# Patient Record
Sex: Female | Born: 1990 | Race: White | Hispanic: No | Marital: Single | Smoking: Never smoker
Health system: Southern US, Community
[De-identification: ages and names within clinical notes are randomized; demographics above are authoritative.]

## PROBLEM LIST (undated history)

## (undated) DIAGNOSIS — I10 Essential (primary) hypertension: Secondary | ICD-10-CM

## (undated) HISTORY — PX: TONSILLECTOMY: SUR1361

## (undated) HISTORY — PX: ADENOIDECTOMY: SUR15

---
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
## 2018-05-05 ENCOUNTER — Other Ambulatory Visit: Payer: Self-pay

## 2018-05-05 ENCOUNTER — Encounter (HOSPITAL_COMMUNITY): Payer: Self-pay | Admitting: Emergency Medicine

## 2018-05-05 ENCOUNTER — Emergency Department (HOSPITAL_COMMUNITY): Payer: Self-pay

## 2018-05-05 ENCOUNTER — Emergency Department (HOSPITAL_COMMUNITY)
Admission: EM | Admit: 2018-05-05 | Discharge: 2018-05-05 | Disposition: A | Discharge status: Home / Self Care | Payer: Self-pay | Attending: Emergency Medicine | Admitting: Emergency Medicine

## 2018-05-05 DIAGNOSIS — R002 Palpitations: Secondary | ICD-10-CM | POA: Insufficient documentation

## 2018-05-05 DIAGNOSIS — I10 Essential (primary) hypertension: Secondary | ICD-10-CM | POA: Insufficient documentation

## 2018-05-05 LAB — BASIC METABOLIC PANEL
Anion gap: 10 (ref 5–15)
BUN: 10 mg/dL (ref 6–20)
CO2: 21 mmol/L — ABNORMAL LOW (ref 22–32)
Calcium: 9.3 mg/dL (ref 8.9–10.3)
Chloride: 109 mmol/L (ref 98–111)
Creatinine, Ser: 0.72 mg/dL (ref 0.44–1.00)
GFR calc Af Amer: >60 mL/min (ref >60)
GFR calc non Af Amer: >60 mL/min (ref >60)
Glucose, Bld: 102 mg/dL — ABNORMAL HIGH (ref 70–99)
Potassium: 3.7 mmol/L (ref 3.5–5.1)
Sodium: 140 mmol/L (ref 135–145)

## 2018-05-05 LAB — CBC
HCT: 41 % (ref 36.0–46.0)
Hemoglobin: 11.9 g/dL — ABNORMAL LOW (ref 12.0–15.0)
MCH: 24.8 pg — ABNORMAL LOW (ref 26.0–34.0)
MCHC: 29 g/dL — ABNORMAL LOW (ref 30.0–36.0)
MCV: 85.4 fL (ref 80.0–100.0)
Platelets: 324 10*3/uL (ref 150–400)
RBC: 4.8 MIL/uL (ref 3.87–5.11)
RDW: 16.2 % — ABNORMAL HIGH (ref 11.5–15.5)
WBC: 9.3 10*3/uL (ref 4.0–10.5)
nRBC: 0 % (ref 0.0–0.2)

## 2018-05-05 LAB — TSH: TSH: 0.947 u[IU]/mL (ref 0.350–4.500)

## 2018-05-05 LAB — I-STAT BETA HCG BLOOD, ED (MC, WL, AP ONLY): I-stat hCG, quantitative: <5 m[IU]/mL (ref <5)

## 2018-05-05 LAB — D-DIMER, QUANTITATIVE: D-Dimer, Quant: <0.27 ug/mL-FEU (ref 0.00–0.50)

## 2018-05-05 LAB — TROPONIN I: Troponin I: <0.03 ng/mL (ref <0.03)

## 2018-05-05 MED ORDER — SODIUM CHLORIDE 0.9 % IV BOLUS
1000.0000 mL | Freq: Once | INTRAVENOUS | Status: AC
  Administered 2018-05-05: 1000 mL via INTRAVENOUS

## 2018-05-05 NOTE — ED Triage Notes (Signed)
Pt arrives by POV with complaints of heart racing. Pt states it has been going on for about a month and she just can not manage it any longer. Pt endorses SOB

## 2018-05-05 NOTE — ED Notes (Signed)
Patient transported to X-ray 

## 2018-05-05 NOTE — Discharge Instructions (Signed)
Please call and follow up with your primary care provider or with cardiology for further evaluation of your heart palpitation.  You may benefit from wearing a Holter Monitoring device for a more indepth cardiac evaluation of your symptom.  This can be discuss with your doctor.  Return if you have any concerns.

## 2018-05-05 NOTE — ED Notes (Signed)
Pt is transgendering and likes to be called "Caitlyn Castro"

## 2018-05-05 NOTE — ED Provider Notes (Signed)
[facility] Provider Note   CSN: [id number] Arrival date & time: 05/05/18  1013    History   Chief Complaint No chief complaint on file.   HPI Caitlyn Castro is a 28 y.o. female.     The history is provided by the patient. No language interpreter was used.  Palpitations     28 year old female presenting for evaluation of her palpitation.  Patient report for the past month she has noticed increased episodes of heart palpitation lasting seconds to minutes usually brought on by exertion but sometimes can be present at rest.  States that she would get out of breath very short of breath with simple exertion and even sometimes with walking short distance.  At times she feels lightheadedness and dizzy.  She is tries cutting of caffeine anything taking caused her heart to race without any improvement.  She does admits to trying to stay more active and trying to lose weight but this is making difficult for her to do so.  She denies any associated fever, URI symptoms, productive cough, chest pain, nausea vomiting diarrhea.  She denies any history of thyroid disease and denies any temperature sensitivity.  She also denies any prior history of PE or DVT, no recent surgery, prolonged bedrest, active cancer, hemoptysis, leg swelling or calf pain, or taking hormone pills.  Past Medical History:  Diagnosis Date  . Hypertension     There are no active problems to display for this patient.   Past Surgical History:  Procedure Laterality Date  . ADENOIDECTOMY    . TONSILLECTOMY       OB History   No obstetric history on file.      Home Medications    Prior to Admission medications   Medication Sig Start Date End Date Taking? Authorizing Provider  penicillin v potassium (VEETID) 500 MG tablet 2 po bid with food 11/04/10   Ivery QualeBryant, [place], PA-C    Family History No family history on file.  Social History Social History   Tobacco Use  .  Smoking status: Never Smoker  Substance Use Topics  . Alcohol use: No  . Drug use: No     Allergies   Patient has no known allergies.   Review of Systems Review of Systems  Cardiovascular: Positive for palpitations.  All other systems reviewed and are negative.    Physical Exam Updated Vital Signs BP 127/77   Pulse 73   Temp 98.2 F (36.8 C) (Oral)   Resp 17   Ht 5\' 2"  (1.575 m)   Wt (!) 149.7 kg   LMP 04/17/2018 (Approximate)   SpO2 97%   BMI 60.36 kg/m   Physical Exam Vitals signs and nursing note reviewed.  Constitutional:      General: She is not in acute distress.    Appearance: She is well-developed. She is obese.  HENT:     Head: Atraumatic.     Mouth/Throat:     Mouth: Mucous membranes are moist.  Eyes:     Conjunctiva/sclera: Conjunctivae normal.  Neck:     Musculoskeletal: Neck supple.     Comments: No thyromegaly Cardiovascular:     Rate and Rhythm: Normal rate and regular rhythm.     Pulses: Normal pulses.     Heart sounds: Normal heart sounds.  Pulmonary:     Effort: Pulmonary effort is normal.     Breath sounds: Normal breath sounds.  Abdominal:     Palpations: Abdomen is soft.  Tenderness: There is no abdominal tenderness.  Skin:    Capillary Refill: Capillary refill takes less than 2 seconds.     Findings: No rash.  Neurological:     Mental Status: She is alert and oriented to person, place, and time.      ED Treatments / Results  Labs (all labs ordered are listed, but only abnormal results are displayed) Labs Reviewed  BASIC METABOLIC PANEL - Abnormal; Notable for the following components:      Result Value   CO2 21 (*)    Glucose, Bld 102 (*)    All other components within normal limits  CBC - Abnormal; Notable for the following components:   Hemoglobin 11.9 (*)    MCH 24.8 (*)    MCHC 29.0 (*)    RDW 16.2 (*)    All other components within normal limits  TROPONIN I  D-DIMER, QUANTITATIVE (NOT AT [facility])  TSH   I-STAT BETA HCG BLOOD, ED ([facility], [facility], [facility] ONLY)    EKG EKG Interpretation  Date/Time:  Friday May 05 2018 10:30:04 EDT Ventricular Rate:  82 PR Interval:    QRS Duration: 101 QT Interval:  373 QTC Calculation: 436 R Axis:   67 Text Interpretation:  Sinus rhythm Low voltage, precordial leads Confirmed by Raeford Razor [phone]) on 05/05/2018 11:23:58 AM   Radiology Dg Chest 2 View  Result Date: 05/05/2018 CLINICAL DATA:  Shortness of breath on exertion started a few days ago. EXAM: CHEST - 2 VIEW COMPARISON:  04/28/2018 FINDINGS: The heart size and mediastinal contours are within normal limits. Both lungs are clear. The visualized skeletal structures are unremarkable. IMPRESSION: No active cardiopulmonary disease. Electronically Signed   By: Norva Pavlov M.D.   On: 05/05/2018 12:03    Procedures Procedures (including critical care time)  Medications Ordered in ED Medications  sodium chloride 0.9 % bolus 1,000 mL (1,000 mLs Intravenous New Bag/Given 05/05/18 1123)     Initial Impression / Assessment and Plan / ED Course  I have reviewed the triage vital signs and the nursing notes.  Pertinent labs & imaging results that were available during my care of the patient were reviewed by me and considered in my medical decision making (see chart for details).        BP 127/77   Pulse 73   Temp 98.2 F (36.8 C) (Oral)   Resp 17   Ht 5\' 2"  (1.575 m)   Wt (!) 149.7 kg   LMP 04/17/2018 (Approximate)   SpO2 97%   BMI 60.36 kg/m    Final Clinical Impressions(s) / ED Diagnoses   Final diagnoses:  Palpitations    ED Discharge Orders    None     10:52 AM Patient report intermittent heart palpitation, shortness of breath, and dizziness and lightheadedness which is been an ongoing issue for more than a month.  She is obese without any significant risk factor for PE aside from obesity.  No prior history of thyroid disease.  Pregnancy test today is negative, very mild anemia  with a hemoglobin of 11.9, EKG without concerning arrhythmia.  Will obtain TSH as well as a d-dimer along with further evaluation of her condition.  Will also check orthostatic vital sign.  1:11 PM Patient has normal orthostatic vital sign, her thyroid function panel was normal, pregnancy test is negative, electrolyte panels are reassuring, no significant anemia, normal troponin, normal d-dimer low suspicion for PE.  At this time, no acute emergent medical condition identified.  Patient  likely benefit from outpatient Holter monitoring by cardiology for further evaluation of her heart palpitation.  Resources provided.  Referral given.  Return precaution discussed.   Fayrene Helper, PA-C 05/05/18 1315    Raeford Razor, MD 05/12/18 [phone]

## 2018-06-23 ENCOUNTER — Ambulatory Visit: Payer: Medicaid Other | Admitting: Cardiovascular Disease

## 2018-06-23 ENCOUNTER — Telehealth: Payer: Self-pay | Admitting: *Deleted

## 2018-06-23 NOTE — Telephone Encounter (Signed)
Tried contacting patient but number in chart was not the right number!!!

## 2019-09-07 IMAGING — DX CHEST - 2 VIEW
2 series · 2 of 2 positions shown · non-contrast
Comparison: 04/28/2018

CLINICAL DATA: Shortness of breath on exertion started a few days
ago.

EXAM:
CHEST - 2 VIEW

[w chest pa]
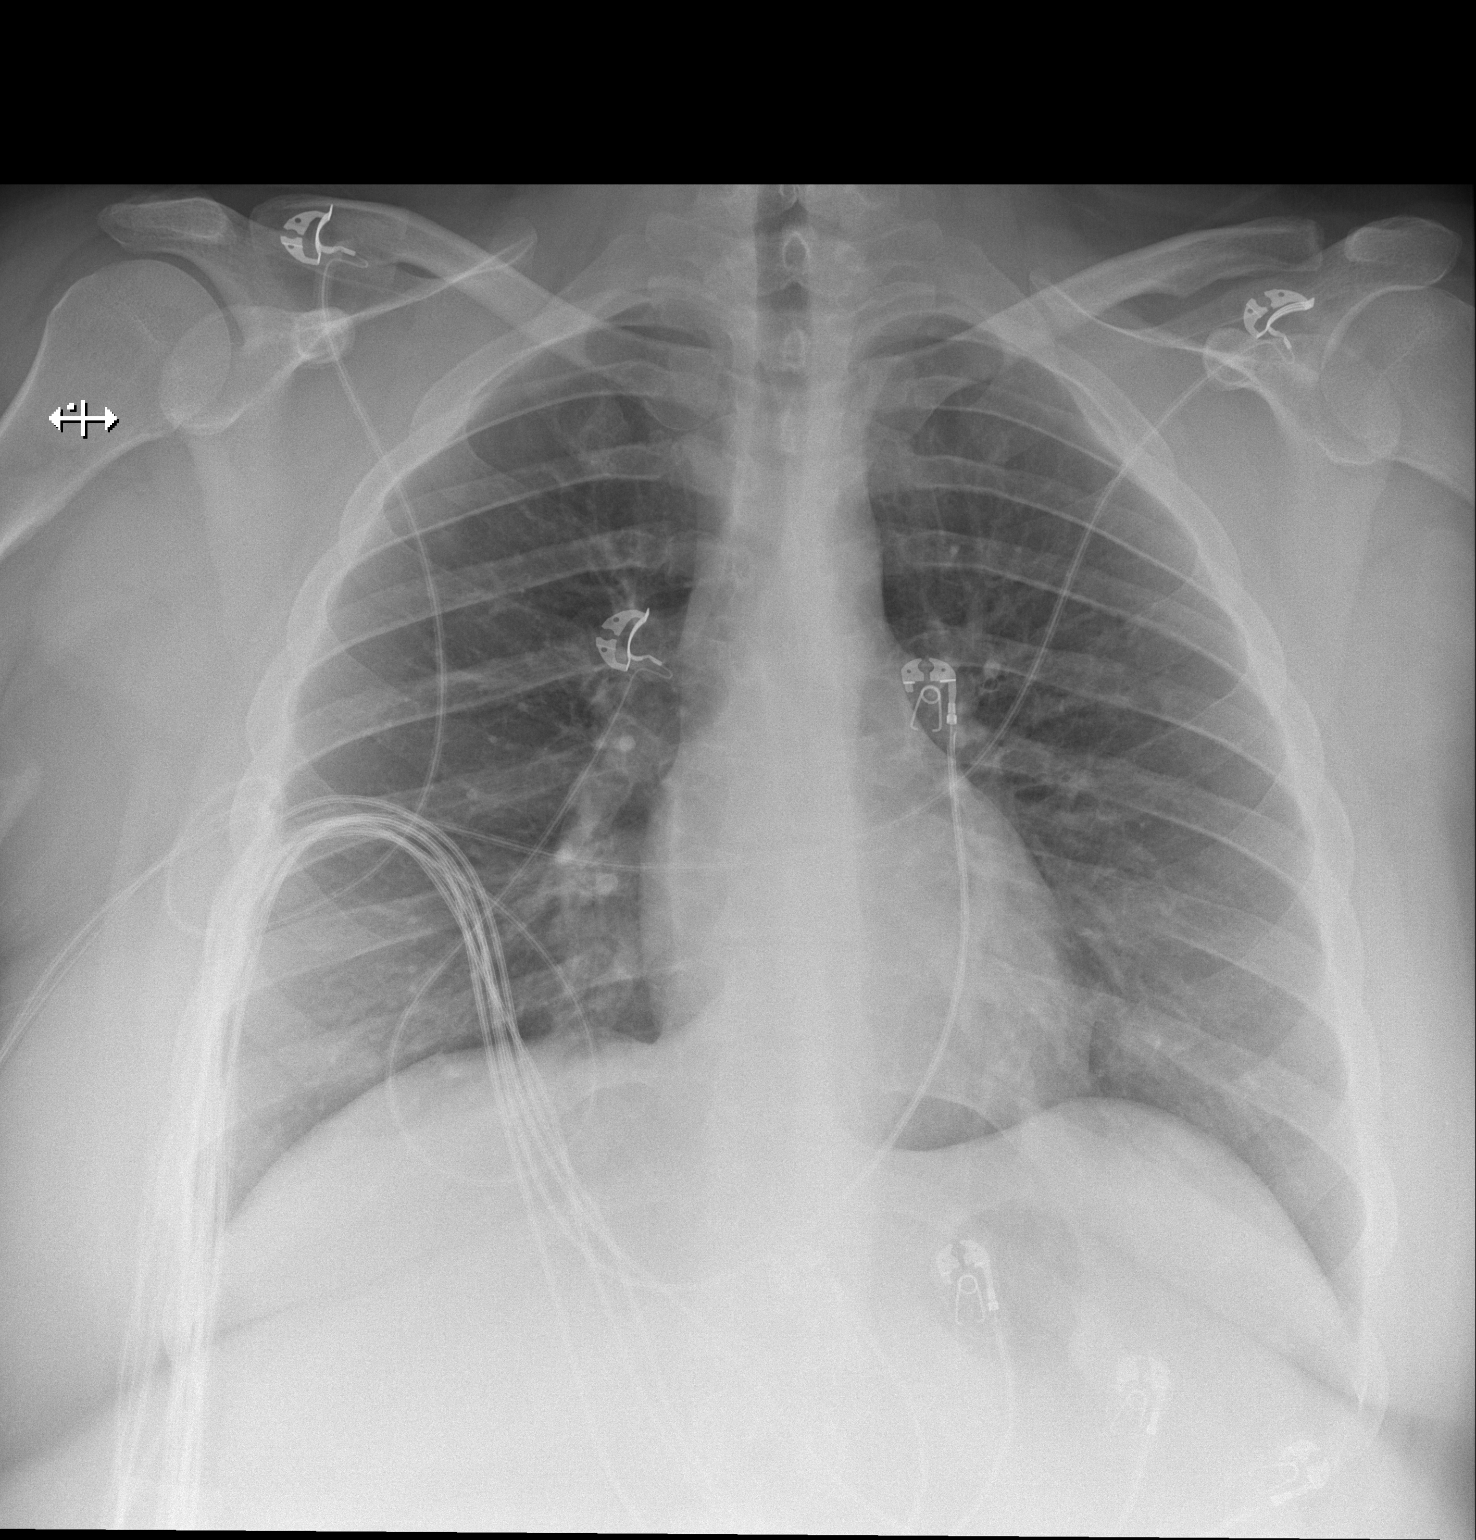

[w chest lat]
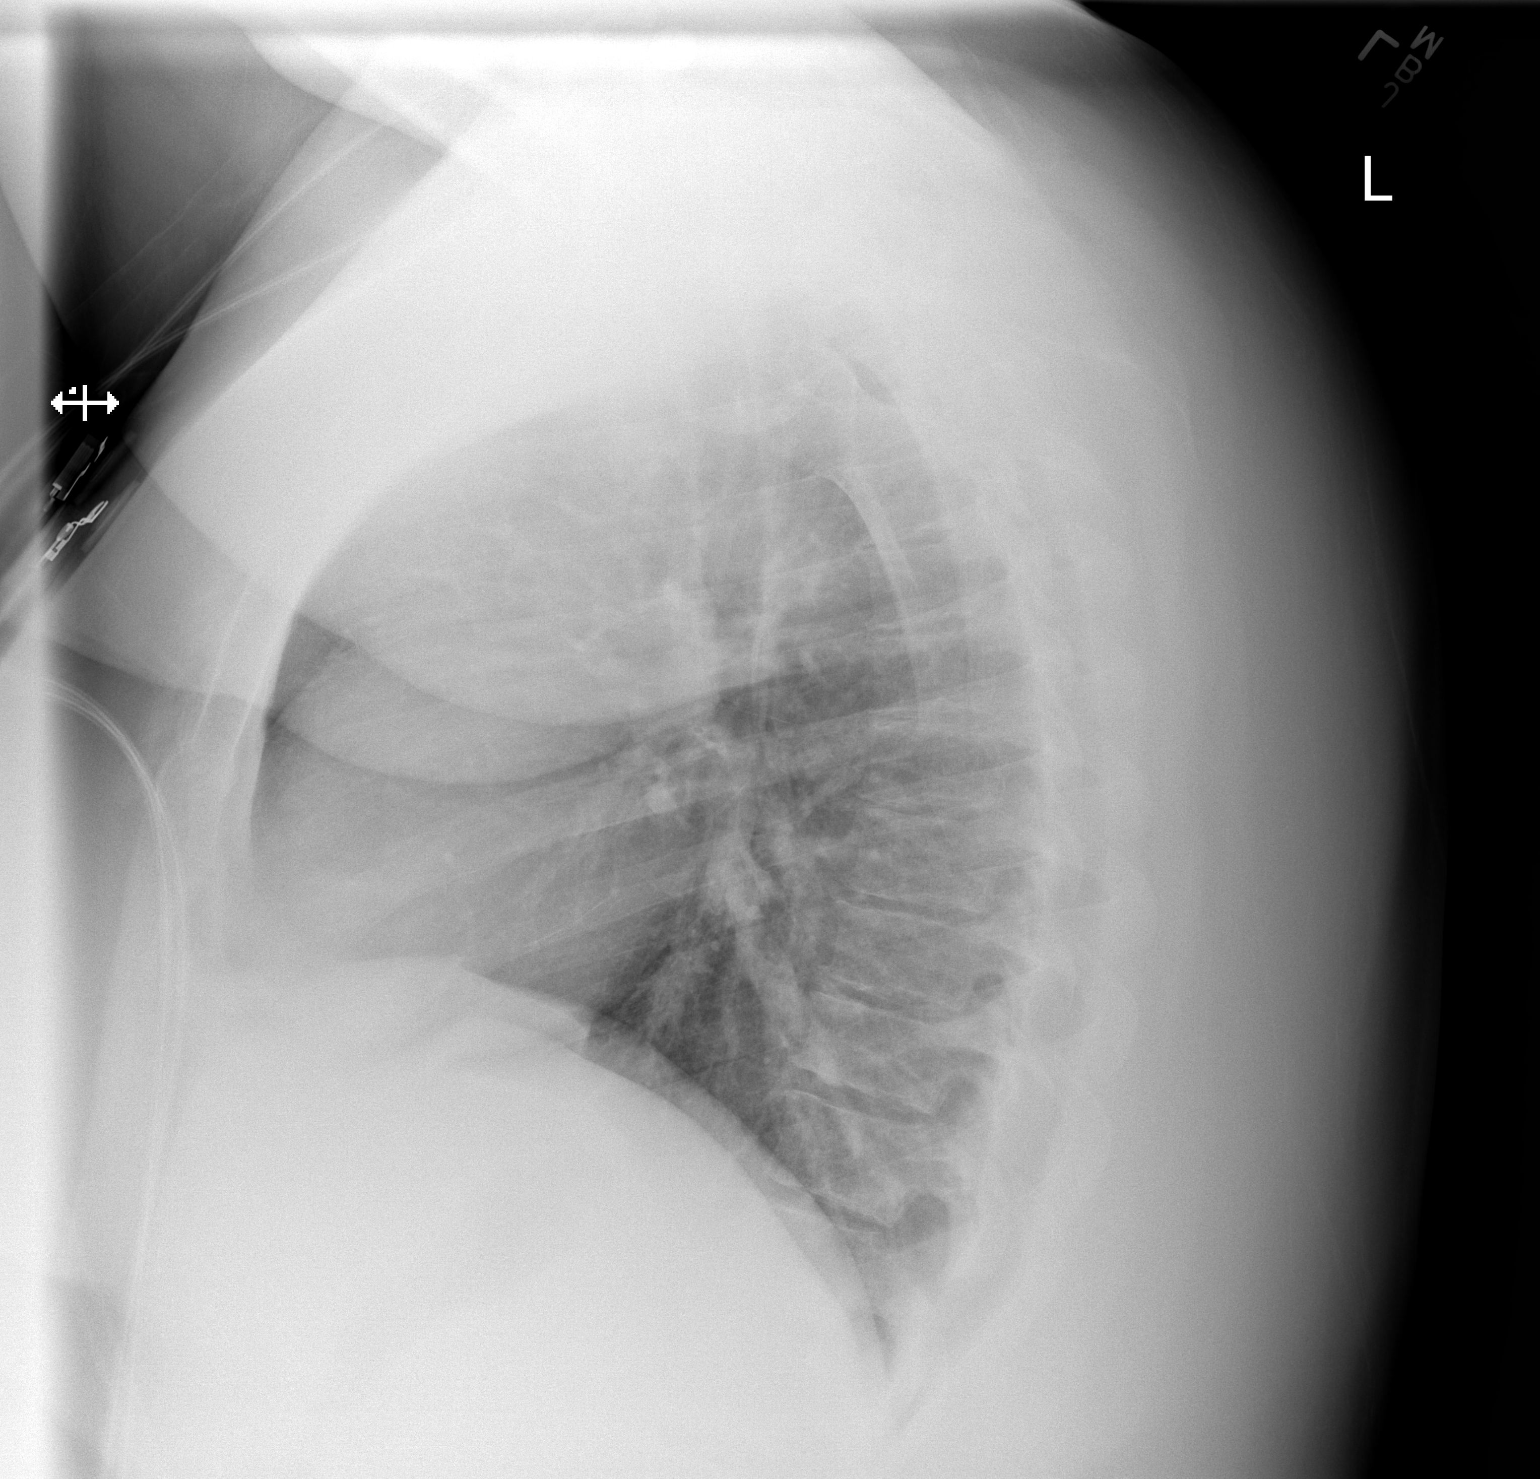

[2 of 2 positions shown; findings below may reference images not displayed]

FINDINGS: The heart size and mediastinal contours are within normal limits.
Both lungs are clear. The visualized skeletal structures are
unremarkable.
IMPRESSION: No active cardiopulmonary disease.

## 2022-01-22 DIAGNOSIS — Z419 Encounter for procedure for purposes other than remedying health state, unspecified: Secondary | ICD-10-CM | POA: Diagnosis not present

## 2022-02-22 DIAGNOSIS — Z419 Encounter for procedure for purposes other than remedying health state, unspecified: Secondary | ICD-10-CM | POA: Diagnosis not present

## 2022-03-25 DIAGNOSIS — Z419 Encounter for procedure for purposes other than remedying health state, unspecified: Secondary | ICD-10-CM | POA: Diagnosis not present

## 2022-04-23 DIAGNOSIS — Z419 Encounter for procedure for purposes other than remedying health state, unspecified: Secondary | ICD-10-CM | POA: Diagnosis not present

## 2022-05-24 DIAGNOSIS — Z419 Encounter for procedure for purposes other than remedying health state, unspecified: Secondary | ICD-10-CM | POA: Diagnosis not present

## 2022-06-23 DIAGNOSIS — Z419 Encounter for procedure for purposes other than remedying health state, unspecified: Secondary | ICD-10-CM | POA: Diagnosis not present

## 2022-07-24 DIAGNOSIS — Z419 Encounter for procedure for purposes other than remedying health state, unspecified: Secondary | ICD-10-CM | POA: Diagnosis not present

## 2022-08-23 DIAGNOSIS — Z419 Encounter for procedure for purposes other than remedying health state, unspecified: Secondary | ICD-10-CM | POA: Diagnosis not present

## 2022-09-23 DIAGNOSIS — Z419 Encounter for procedure for purposes other than remedying health state, unspecified: Secondary | ICD-10-CM | POA: Diagnosis not present

## 2022-10-24 DIAGNOSIS — Z419 Encounter for procedure for purposes other than remedying health state, unspecified: Secondary | ICD-10-CM | POA: Diagnosis not present

## 2022-11-23 DIAGNOSIS — Z419 Encounter for procedure for purposes other than remedying health state, unspecified: Secondary | ICD-10-CM | POA: Diagnosis not present

## 2022-12-24 DIAGNOSIS — Z419 Encounter for procedure for purposes other than remedying health state, unspecified: Secondary | ICD-10-CM | POA: Diagnosis not present

## 2023-01-23 DIAGNOSIS — Z419 Encounter for procedure for purposes other than remedying health state, unspecified: Secondary | ICD-10-CM | POA: Diagnosis not present

## 2023-02-23 DIAGNOSIS — Z419 Encounter for procedure for purposes other than remedying health state, unspecified: Secondary | ICD-10-CM | POA: Diagnosis not present

## 2023-03-26 DIAGNOSIS — Z419 Encounter for procedure for purposes other than remedying health state, unspecified: Secondary | ICD-10-CM | POA: Diagnosis not present

## 2023-04-23 DIAGNOSIS — Z419 Encounter for procedure for purposes other than remedying health state, unspecified: Secondary | ICD-10-CM | POA: Diagnosis not present
